# Patient Record
Sex: Male | Born: 1990 | Race: White | Hispanic: No | Marital: Single | Smoking: Never smoker
Health system: Southern US, Community
[De-identification: ages and names within clinical notes are randomized; demographics above are authoritative.]

## PROBLEM LIST (undated history)

## (undated) DIAGNOSIS — K219 Gastro-esophageal reflux disease without esophagitis: Secondary | ICD-10-CM

## (undated) DIAGNOSIS — J302 Other seasonal allergic rhinitis: Secondary | ICD-10-CM

---

## 2012-12-14 ENCOUNTER — Other Ambulatory Visit: Payer: Self-pay | Admitting: Internal Medicine

## 2012-12-14 ENCOUNTER — Ambulatory Visit
Admission: RE | Admit: 2012-12-14 | Discharge: 2012-12-14 | Disposition: A | Discharge status: Home / Self Care | Payer: BC Managed Care – PPO | Source: Ambulatory Visit | Attending: Internal Medicine | Admitting: Internal Medicine

## 2012-12-14 DIAGNOSIS — R042 Hemoptysis: Secondary | ICD-10-CM

## 2013-09-20 ENCOUNTER — Encounter (HOSPITAL_COMMUNITY): Payer: Self-pay | Admitting: Emergency Medicine

## 2013-09-20 ENCOUNTER — Emergency Department (INDEPENDENT_AMBULATORY_CARE_PROVIDER_SITE_OTHER): Payer: BC Managed Care – PPO

## 2013-09-20 ENCOUNTER — Emergency Department (HOSPITAL_COMMUNITY)
Admission: EM | Admit: 2013-09-20 | Discharge: 2013-09-20 | Disposition: A | Discharge status: Home / Self Care | Payer: BC Managed Care – PPO | Source: Home / Self Care | Attending: Family Medicine | Admitting: Family Medicine

## 2013-09-20 DIAGNOSIS — J309 Allergic rhinitis, unspecified: Secondary | ICD-10-CM

## 2013-09-20 DIAGNOSIS — J302 Other seasonal allergic rhinitis: Secondary | ICD-10-CM

## 2013-09-20 LAB — POCT URINALYSIS DIP (DEVICE)
BILIRUBIN URINE: NEGATIVE
Glucose, UA: NEGATIVE mg/dL
Hgb urine dipstick: NEGATIVE
KETONES UR: NEGATIVE mg/dL
LEUKOCYTES UA: NEGATIVE
Nitrite: NEGATIVE
PH: 7 (ref 5.0–8.0)
Protein, ur: NEGATIVE mg/dL
SPECIFIC GRAVITY, URINE: 1.02 (ref 1.005–1.030)
Urobilinogen, UA: 0.2 mg/dL (ref 0.0–1.0)

## 2013-09-20 MED ORDER — FLUTICASONE PROPIONATE 50 MCG/ACT NA SUSP: 1.0000 | Freq: Two times a day (BID) | NASAL | Status: AC

## 2013-09-20 MED ORDER — CETIRIZINE HCL 10 MG PO TABS: 10.0000 mg | ORAL_TABLET | Freq: Every day | ORAL | Status: DC

## 2013-09-20 NOTE — ED Notes (Signed)
Pt       Reports        Symptoms       Of  Cough  Congestion         Low  Back  Pain  As  Well  As  Burning on  urintaion  Pt   Wants  To  Talk  To  Dr    About  Personal issue           Pt   Ambulated  To  Room  With  s  Steady fluid gait

## 2013-09-20 NOTE — ED Provider Notes (Signed)
CSN: [id number]     Arrival date & time 09/20/13  1349 History   First MD Initiated Contact with Patient 09/20/13 1606     Chief Complaint  Patient presents with  . Cough   (Consider location/radiation/quality/duration/timing/severity/associated sxs/prior Treatment) Patient is a 23 y.o. male presenting with cough. The history is provided by the patient.  Cough Cough characteristics:  Non-productive Severity:  Moderate Onset quality:  Gradual Duration:  10 days Chronicity:  New Smoker: no   Context: exposure to allergens, upper respiratory infection and weather changes   Relieved by:  None tried Worsened by:  Nothing tried Ineffective treatments:  None tried Associated symptoms: rhinorrhea   Associated symptoms: no fever, no shortness of breath and no wheezing     History reviewed. No pertinent past medical history. History reviewed. No pertinent past surgical history. History reviewed. No pertinent family history. History  Substance Use Topics  . Smoking status: Never Smoker   . Smokeless tobacco: Not on file  . Alcohol Use: No    Review of Systems  Constitutional: Negative.  Negative for fever.  HENT: Positive for congestion, postnasal drip and rhinorrhea.   Respiratory: Positive for cough. Negative for shortness of breath and wheezing.   Cardiovascular: Negative.     Allergies  Review of patient's allergies indicates no known allergies.  Home Medications   Prior to Admission medications   Medication Sig Start Date End Date Taking? Authorizing Provider  cetirizine (ZYRTEC) 10 MG tablet Take 1 tablet (10 mg total) by mouth daily. One tab daily for allergies 09/20/13   Linna Hoff, MD  fluticasone [facility]) 50 MCG/ACT nasal spray Place 1 spray into both nostrils 2 (two) times daily. 09/20/13   Linna Hoff, MD   BP 130/81  Pulse 68  Temp(Src) 98.4 F (36.9 C) (Oral)  Resp 14  SpO2 100% Physical Exam  Nursing note and vitals reviewed. Constitutional: He is  oriented to person, place, and time. He appears well-developed and well-nourished.  Neck: Normal range of motion. Neck supple.  Cardiovascular: Normal heart sounds.   Pulmonary/Chest: Effort normal and breath sounds normal.  Lymphadenopathy:    He has no cervical adenopathy.  Neurological: He is alert and oriented to person, place, and time.  Skin: Skin is warm and dry.    ED Course  Procedures (including critical care time) Labs Review Labs Reviewed  POCT URINALYSIS DIP (DEVICE)    Imaging Review Dg Chest 2 View  09/20/2013   CLINICAL DATA:  Shortness of breath and chest congestion which began approximately 10 days ago. Cough and chest pain which began approximately 4 days ago.  EXAM: CHEST  2 VIEW  COMPARISON:  CT chest 12/14/2012.  FINDINGS: Cardiomediastinal silhouette unremarkable. Pleuroparenchymal scarring at the left base, unchanged from the prior CT. Lungs otherwise clear. No localized airspace consolidation. No pleural effusions. No pneumothorax. Normal pulmonary vascularity. Visualized bony thorax intact.  IMPRESSION: No acute cardiopulmonary disease. Stable pleuroparenchymal scarring at the left base.   Electronically Signed   By: Hulan Saas M.D.   On: 09/20/2013 16:46     MDM   1. Seasonal allergic rhinitis       Linna Hoff, MD 09/20/13 1655

## 2013-09-25 ENCOUNTER — Encounter: Payer: Self-pay | Admitting: Emergency Medicine

## 2013-09-25 ENCOUNTER — Emergency Department
Admission: EM | Admit: 2013-09-25 | Discharge: 2013-09-25 | Disposition: A | Discharge status: Home / Self Care | Payer: BC Managed Care – PPO | Source: Home / Self Care | Attending: Family Medicine | Admitting: Family Medicine

## 2013-09-25 ENCOUNTER — Telehealth: Payer: Self-pay | Admitting: *Deleted

## 2013-09-25 DIAGNOSIS — Q828 Other specified congenital malformations of skin: Secondary | ICD-10-CM

## 2013-09-25 DIAGNOSIS — L858 Other specified epidermal thickening: Secondary | ICD-10-CM

## 2013-09-25 DIAGNOSIS — J209 Acute bronchitis, unspecified: Secondary | ICD-10-CM

## 2013-09-25 HISTORY — DX: Other seasonal allergic rhinitis: J30.2

## 2013-09-25 HISTORY — DX: Gastro-esophageal reflux disease without esophagitis: K21.9

## 2013-09-25 MED ORDER — DOXYCYCLINE HYCLATE 100 MG PO CAPS: 100.0000 mg | ORAL_CAPSULE | Freq: Two times a day (BID) | ORAL | Status: DC

## 2013-09-25 NOTE — ED Notes (Signed)
Pt c/o nasal congestion, cough, post nasal drip, and sore throat x 3 wks. He reports being seen at [facility] urgent care, had a negative CVR and was given allergy meds which helped some. He also, c/o panic attacks x 5-6 wks. He has an appt with a psychiatrist tomorrow. He also c/o LBP, groin, and testicular pain x 4-5 days after mediation.

## 2013-09-25 NOTE — ED Provider Notes (Signed)
CSN: [id number]     Arrival date & time 09/25/13  1450 History   First MD Initiated Contact with Patient 09/25/13 1535     Chief Complaint  Patient presents with  . Nasal Congestion  . Testicle Pain  . Back Pain  . Anxiety     HPI Comments: Patient presents with a variety of complaints: 1)  His primary complaint is a persistent cough.  He developed a cold-like illness about two weeks ago with sore throat and nasal congestion, but the non-productive cough has persisted.  No fevers, chills, and sweats and he feels well otherwise.  He has a history of pneumonia December 2014.  He visited [facility] urgent care where a chest X-ray was negative and diagnosed with seasonal allergic rhinitis.  He was advised to begin antihistamine product.  He denies pleuritic pain or shortness of breath.  He has a history of GERD but he has not had a cough associated with that. 2)  He complains of one week history of vague lower back pain that occasionally radiates to his left leg, worse when sitting and decreased when standing.  No bowel or bladder dysfunction.  No saddle numbness.  He states that he has resumed working out in a gym over the past several months after having been sedentary in school (just graduated from college).  He also complains of vague bilateral knee pain when exercising and pain in his right foot. 3)  He complains of a lingering rash on his upper arms and upper back, slightly pruritic.  4)   He complains of anxiety and panic attacks for about 6 weeks but admits that he has an appointment with psychiatrist tomorrow.   The history is provided by the patient.    Past Medical History  Diagnosis Date  . Seasonal allergies   . GERD (gastroesophageal reflux disease)    History reviewed. No pertinent past surgical history. Family History  Problem Relation Age of Onset  . Diabetes Father    History  Substance Use Topics  . Smoking status: Never Smoker   . Smokeless tobacco: Not on file  .  Alcohol Use: No    Review of Systems + Anxiety; denies depression No sore throat + cough No pleuritic pain No wheezing + nasal congestion No post-nasal drainage No sinus pain/pressure No itchy/red eyes No earache No hemoptysis No SOB No fever/chills No nausea No vomiting No abdominal pain No diarrhea No urinary symptoms No skin rash No fatigue No myalgias + lower back pain, knee pain, and right foot pain. No headache Used OTC meds without relief  Allergies  Review of patient's allergies indicates no known allergies.  Home Medications   Prior to Admission medications   Medication Sig Start Date End Date Taking? Authorizing Provider  cetirizine (ZYRTEC) 10 MG tablet Take 1 tablet (10 mg total) by mouth daily. One tab daily for allergies 09/20/13   Linna Hoff, MD  doxycycline (VIBRAMYCIN) 100 MG capsule Take 1 capsule (100 mg total) by mouth 2 (two) times daily. 09/25/13   Lattie Haw, MD  fluticasone (FLONASE) 50 MCG/ACT nasal spray Place 1 spray into both nostrils 2 (two) times daily. 09/20/13   Linna Hoff, MD   BP 121/76  Pulse 94  Temp(Src) 98.4 F (36.9 C) (Oral)  Resp 16  Ht 6' (1.829 m)  Wt 227 lb (102.967 kg)  BMI 30.78 kg/m2  SpO2 98% Physical Exam  Nursing note and vitals reviewed. Constitutional: He is oriented to person, place,  and time. He appears well-developed and well-nourished. No distress.  Patient is noted to ambulate, and climb onto exam table comfortably and without pain.  HENT:  Head: Normocephalic and atraumatic.  Nose: Nose normal.  Mouth/Throat: Oropharynx is clear and moist.  Eyes: Conjunctivae are normal. Pupils are equal, round, and reactive to light.  Neck: Neck supple.  Cardiovascular: Normal heart sounds.   Pulmonary/Chest: Breath sounds normal.  Abdominal: Bowel sounds are normal. There is no tenderness.  Musculoskeletal: He exhibits no edema.  Lower back has minimal tenderness over SI joints.  Straight leg raising test  is negative.  Sitting knee extension test is negative.  Strength and sensation in the lower extremities is normal.  Patellar and achilles reflexes are normal.  Both knees have full range of motion and are not tender to palpation.  Lymphadenopathy:    He has no cervical adenopathy.  Neurological: He is alert and oriented to person, place, and time.  Skin: Skin is warm and dry.     Patient's upper arms and upper back have follicular plugging and a "sandpaper" texture.  No erythema.  Psychiatric: He has a normal mood and affect. His behavior is normal. Judgment and thought content normal.    ED Course  Procedures  none         MDM   1. Acute bronchitis   2. Keratosis pilaris    Empirically begin doxycycline to cover atypicals. Take plain Mucinex (1200 mg guaifenesin) twice daily for cough and congestion.  May add Sudafed for sinus congestion.   Increase fluid intake, rest. May use Afrin nasal spray (or generic oxymetazoline) twice daily for about 5 days.  Also recommend using saline nasal spray several times daily and saline nasal irrigation (AYR is a common brand) Stop all antihistamines for now, and other non-prescription cough/cold preparations. May take Delsym Cough Suppressant at bedtime for nighttime cough.   For skin condition, use medicated creams containing alpha-hydroxy acid, lactic acid, salicylic acid or urea.  For his musculoskeletal complaints (probably resulting from recent resumption of athletic activity), recommend that he follow-up with Dr. Tyson Babinskihomas Thekkekandam     Ebba Goll A Dailyn Reith, MD 09/28/13 [phone]

## 2013-09-25 NOTE — Discharge Instructions (Signed)
Take plain Mucinex (1200 mg guaifenesin) twice daily for cough and congestion.  May add Sudafed for sinus congestion.   Increase fluid intake, rest. May use Afrin nasal spray (or generic oxymetazoline) twice daily for about 5 days.  Also recommend using saline nasal spray several times daily and saline nasal irrigation (AYR is a common brand) Stop all antihistamines for now, and other non-prescription cough/cold preparations. May take Delsym Cough Suppressant at bedtime for nighttime cough.   For skin condition, use medicated creams containing alpha-hydroxy acid, lactic acid, salicylic acid or urea.

## 2013-09-30 ENCOUNTER — Ambulatory Visit (INDEPENDENT_AMBULATORY_CARE_PROVIDER_SITE_OTHER): Payer: BC Managed Care – PPO

## 2013-09-30 ENCOUNTER — Encounter: Payer: Self-pay | Admitting: Sports Medicine

## 2013-09-30 ENCOUNTER — Ambulatory Visit (INDEPENDENT_AMBULATORY_CARE_PROVIDER_SITE_OTHER): Payer: BC Managed Care – PPO | Admitting: Sports Medicine

## 2013-09-30 VITALS — BP 108/69 | HR 69 | Ht 74.0 in | Wt 224.0 lb

## 2013-09-30 DIAGNOSIS — M25469 Effusion, unspecified knee: Secondary | ICD-10-CM

## 2013-09-30 DIAGNOSIS — M2550 Pain in unspecified joint: Secondary | ICD-10-CM

## 2013-09-30 DIAGNOSIS — M25579 Pain in unspecified ankle and joints of unspecified foot: Secondary | ICD-10-CM

## 2013-09-30 MED ORDER — PREDNISONE (PAK) 10 MG PO TABS: ORAL_TABLET | ORAL | Status: DC

## 2013-09-30 NOTE — Progress Notes (Signed)
   Subjective:    I'm seeing this patient as a consultation for:  Dr. Cathren Harsh  CC: Multiple joint pains  HPI: This is a pleasant 23 year old male, he has just finished college, he studied [place] languages, and lived in [place] for sometime. He has not been out of the country for years. For the past several weeks he has noted increasing popping, and tightness and soreness in both ankles, both knees, and his back. Pain is moderate, persistent, he denies any trauma, no constitutional symptoms. Symptoms are moderate, persistent.  Past medical history, Surgical history, Family history not pertinant except as noted below, Social history, Allergies, and medications have been entered into the medical record, reviewed, and no changes needed.   Review of Systems: No headache, visual changes, nausea, vomiting, diarrhea, constipation, dizziness, abdominal pain, skin rash, fevers, chills, night sweats, weight loss, swollen lymph nodes, body aches, joint swelling, muscle aches, chest pain, shortness of breath, mood changes, visual or auditory hallucinations.   Objective:   General: Well Developed, well nourished, and in no acute distress.  Neuro/Psych: Alert and oriented x3, extra-ocular muscles intact, able to move all 4 extremities, sensation grossly intact. Skin: Warm and dry, no rashes noted.  Respiratory: Not using accessory muscles, speaking in full sentences, trachea midline.  Cardiovascular: Pulses palpable, no extremity edema. Abdomen: Does not appear distended. Back Exam:  Inspection: Unremarkable  Motion: Flexion 45 deg, Extension 45 deg, Side Bending to 45 deg bilaterally,  Rotation to 45 deg bilaterally  SLR laying: Negative  XSLR laying: Negative  Palpable tenderness: None. FABER: negative. Sensory change: Gross sensation intact to all lumbar and sacral dermatomes.  Reflexes: 2+ at both patellar tendons, 2+ at achilles tendons, Babinski's downgoing.  Strength at foot  Plantar-flexion: 5/5  Dorsi-flexion: 5/5 Eversion: 5/5 Inversion: 5/5  Leg strength  Quad: 5/5 Hamstring: 5/5 Hip flexor: 5/5 Hip abductors: 5/5  Gait unremarkable. Bilateral Knee: Normal to inspection with no erythema or effusion or obvious bony abnormalities. Palpation normal with no warmth, joint line tenderness, patellar tenderness, or condyle tenderness. ROM full in flexion and extension and lower leg rotation. Ligaments with solid consistent endpoints including ACL, PCL, LCL, MCL. Negative Mcmurray's, Apley's, and Thessalonian tests. Non painful patellar compression. Patellar glide without crepitus. Patellar and quadriceps tendons unremarkable. Hamstring and quadriceps strength is normal.  Bilateral Ankle: No visible erythema or swelling. Range of motion is full in all directions. Strength is 5/5 in all directions. Stable lateral and medial ligaments; squeeze test and kleiger test unremarkable; Talar dome nontender; No pain at base of 5th MT; No tenderness over cuboid; No tenderness over N spot or navicular prominence No tenderness on posterior aspects of lateral and medial malleolus No sign of peroneal tendon subluxations or tenderness to palpation Negative tarsal tunnel tinel's Able to walk 4 steps.  Ankle, spine x-rays are negative, knee x-rays do show a mild effusion.  Impression and Recommendations:   This case required medical decision making of moderate complexity.

## 2013-09-30 NOTE — Assessment & Plan Note (Addendum)
I think that Andres Murphy has the majority of benign findings. Considering his polyarthralgias we are going to check a rheumatoid and arthritis panel, also like to check an HLA-B27 and x-ray his ankles, knees, and lumbar spine.  We are going to do a 12 day taper of prednisone and like to see him back. He did mention some slight numbness in his groin, and pain radiating from his back down his legs so we will probably get an early MRI of the two-week point if he's not better.  Considering his travels to [place] we did need to consider viral etiologies as well if the initial workup is negative.

## 2013-10-01 LAB — COMPREHENSIVE METABOLIC PANEL
Alkaline Phosphatase: 54 U/L (ref 39–117)
Chloride: 103 mEq/L (ref 96–112)
Glucose, Bld: 86 mg/dL (ref 70–99)
Sodium: 140 mEq/L (ref 135–145)
Total Bilirubin: 1.1 mg/dL (ref 0.2–1.2)
Total Protein: 7.3 g/dL (ref 6.0–8.3)

## 2013-10-01 LAB — SEDIMENTATION RATE: Sed Rate: 1 mm/h (ref 0–16)

## 2013-10-01 LAB — HLA-B27 ANTIGEN: DNA Result:: NOT DETECTED

## 2013-10-01 LAB — CYCLIC CITRUL PEPTIDE ANTIBODY, IGG: Cyclic Citrullin Peptide Ab: <2 U/mL (ref 0.0–5.0)

## 2013-10-01 LAB — CBC WITH DIFFERENTIAL/PLATELET
Basophils Absolute: 0.1 K/uL (ref 0.0–0.1)
Basophils Relative: 1 % (ref 0–1)
Eosinophils Absolute: 0.5 10*3/uL (ref 0.0–0.7)
Eosinophils Relative: 8 % — ABNORMAL HIGH (ref 0–5)
HCT: 47.9 % (ref 39.0–52.0)
Hemoglobin: 17.1 g/dL — ABNORMAL HIGH (ref 13.0–17.0)
Lymphocytes Relative: 33 % (ref 12–46)
Lymphs Abs: 2 10*3/uL (ref 0.7–4.0)
MCH: 29.1 pg (ref 26.0–34.0)
MCHC: 35.7 g/dL (ref 30.0–36.0)
MCV: 81.6 fL (ref 78.0–100.0)
Monocytes Absolute: 0.4 K/uL (ref 0.1–1.0)
Monocytes Relative: 7 % (ref 3–12)
Neutro Abs: 3.2 K/uL (ref 1.7–7.7)
Neutrophils Relative %: 51 % (ref 43–77)
Platelets: 133 10*3/uL — ABNORMAL LOW (ref 150–400)
RBC: 5.87 MIL/uL — ABNORMAL HIGH (ref 4.22–5.81)
RDW: 13.6 % (ref 11.5–15.5)
WBC: 6.2 K/uL (ref 4.0–10.5)

## 2013-10-01 LAB — CK: Total CK: 241 U/L — ABNORMAL HIGH (ref 7–232)

## 2013-10-01 LAB — COMPREHENSIVE METABOLIC PANEL WITH GFR
ALT: 18 U/L (ref 0–53)
AST: 19 U/L (ref 0–37)
Albumin: 4.9 g/dL (ref 3.5–5.2)
BUN: 9 mg/dL (ref 6–23)
CO2: 26 meq/L (ref 19–32)
Calcium: 9.6 mg/dL (ref 8.4–10.5)
Creat: 0.75 mg/dL (ref 0.50–1.35)
Potassium: 3.9 meq/L (ref 3.5–5.3)

## 2013-10-01 LAB — RHEUMATOID FACTOR: Rheumatoid fact SerPl-aCnc: <10 [IU]/mL (ref <=14)

## 2013-10-01 LAB — ROCKY MTN SPOTTED FVR ABS PNL(IGG+IGM)
RMSF IgG: 0.1 IV
RMSF IgM: 0.27 IV

## 2013-10-01 LAB — ANA: Anti Nuclear Antibody(ANA): NEGATIVE

## 2013-10-01 LAB — URIC ACID: Uric Acid, Serum: 6.8 mg/dL (ref 4.0–7.8)

## 2013-10-01 LAB — B. BURGDORFI ANTIBODIES: B burgdorferi Ab IgG+IgM: 0.28 {ISR}

## 2013-10-03 LAB — EHRLICHIA ANTIBODY PANEL
E chaffeensis (HGE) Ab, IgG: <1:64 {titer}
E chaffeensis (HGE) Ab, IgM: <1:20 {titer}

## 2013-10-04 ENCOUNTER — Encounter: Payer: Self-pay | Admitting: Sports Medicine

## 2013-10-04 ENCOUNTER — Ambulatory Visit (INDEPENDENT_AMBULATORY_CARE_PROVIDER_SITE_OTHER): Payer: BC Managed Care – PPO | Admitting: Sports Medicine

## 2013-10-04 VITALS — BP 142/66 | HR 82 | Ht 74.0 in | Wt 225.0 lb

## 2013-10-04 DIAGNOSIS — M2550 Pain in unspecified joint: Secondary | ICD-10-CM

## 2013-10-04 DIAGNOSIS — D696 Thrombocytopenia, unspecified: Secondary | ICD-10-CM

## 2013-10-04 NOTE — Assessment & Plan Note (Signed)
50% improvement on prednisone. There were some abnormalities in his lab work, thrombocytopenia, mildly elevated CK, and elevated eosinophil count. He did have some of these symptoms while living in [place], we are going to check for Chikungunya and Dengue viral serologies, as well as vasculitis (he did tell me he had some abnormal splotches on his skin after working out). Certainly if none of these come back positive we can consider muscle biopsy. Considering elevated eosinophils and diarrhea we are going to check stool studies, and serologies for parasitic infections.

## 2013-10-04 NOTE — Progress Notes (Signed)
    Subjective:    CC: Followup  HPI: Andres Murphy returns, he had vague myalgias, joint aches, fatigue, after returning from [place]. He does have occasional diarrhea. We got some blood work, he was mildly thrombocytopenic which he tells us has been present before, he had a mildly elevated CK, and elevated eosinophils. All of the other rheumatoid and tickborne serologies were negative. We placed him on a prednisone taper he tells me he is approximately 50% better.  Past medical history, Surgical history, Family history not pertinant except as noted below, Social history, Allergies, and medications have been entered into the medical record, reviewed, and no changes needed.   Review of Systems: No fevers, chills, night sweats, weight loss, chest pain, or shortness of breath.   Objective:    General: Well Developed, well nourished, and in no acute distress.  Neuro: Alert and oriented x3, extra-ocular muscles intact, sensation grossly intact.  HEENT: Normocephalic, atraumatic, pupils equal round reactive to light, neck supple, no masses, no lymphadenopathy, thyroid nonpalpable.  Skin: Warm and dry, no rashes. Cardiac: Regular rate and rhythm, no murmurs rubs or gallops, no lower extremity edema. Respiratory: Clear to auscultation bilaterally. Not using accessory muscles, speaking in full sentences.  Impression and Recommendations:

## 2013-10-04 NOTE — Assessment & Plan Note (Signed)
As though this is chronic, he tells me he's had low platelets on a prior blood test. We will keep an eye on this.

## 2013-10-11 ENCOUNTER — Other Ambulatory Visit: Payer: Self-pay | Admitting: Sports Medicine

## 2013-10-12 LAB — C. DIFFICILE GDH AND TOXIN A/B
C. difficile GDH: NOT DETECTED
C. difficile Toxin A/B: NOT DETECTED

## 2013-10-14 LAB — OVA AND PARASITE EXAMINATION: OP: NONE SEEN

## 2013-10-15 LAB — GIARDIA ANTIGEN: Giardia Screen (EIA): NEGATIVE

## 2013-10-15 LAB — STOOL CULTURE

## 2013-10-18 ENCOUNTER — Ambulatory Visit (INDEPENDENT_AMBULATORY_CARE_PROVIDER_SITE_OTHER): Payer: BC Managed Care – PPO | Admitting: Sports Medicine

## 2013-10-18 ENCOUNTER — Encounter: Payer: Self-pay | Admitting: Sports Medicine

## 2013-10-18 VITALS — BP 126/74 | HR 85 | Ht 74.0 in | Wt 227.0 lb

## 2013-10-18 DIAGNOSIS — R7989 Other specified abnormal findings of blood chemistry: Secondary | ICD-10-CM

## 2013-10-18 DIAGNOSIS — L503 Dermatographic urticaria: Secondary | ICD-10-CM

## 2013-10-18 DIAGNOSIS — R799 Abnormal finding of blood chemistry, unspecified: Secondary | ICD-10-CM | POA: Insufficient documentation

## 2013-10-18 DIAGNOSIS — D696 Thrombocytopenia, unspecified: Secondary | ICD-10-CM

## 2013-10-18 MED ORDER — LORATADINE 10 MG PO TABS: 10.0000 mg | ORAL_TABLET | Freq: Every day | ORAL | Status: AC

## 2013-10-18 NOTE — Progress Notes (Signed)
  Subjective:    CC: Followup  HPI: Andres Murphy returns with various vague symptoms, muscle aches, bodyaches, occasional fatigue, back pain, occasional loose stools, vague and recurrent rashes, he did recently return from [place], he has been tested for Chikungunya and Dengue viral serologies and all were negative. He also has thrombocytopenia, mild polycythemia, he had an elevated CK level.  Past medical history, Surgical history, Family history not pertinant except as noted below, Social history, Allergies, and medications have been entered into the medical record, reviewed, and no changes needed.   Review of Systems: No fevers, chills, night sweats, weight loss, chest pain, or shortness of breath.   Objective:    General: Well Developed, well nourished, and in no acute distress.  Neuro: Alert and oriented x3, extra-ocular muscles intact, sensation grossly intact.  HEENT: Normocephalic, atraumatic, pupils equal round reactive to light, neck supple, no masses, no lymphadenopathy, thyroid nonpalpable.  Skin: Warm and dry, no rashes. He does have some dermatographia to very light touch. Cardiac: Regular rate and rhythm, no murmurs rubs or gallops, no lower extremity edema.  Respiratory: Clear to auscultation bilaterally. Not using accessory muscles, speaking in full sentences.  Impression and Recommendations:    I spent 40 minutes with this patient, greater than 50% was face-to-face time counseling regarding the below diagnoses.

## 2013-10-18 NOTE — Assessment & Plan Note (Signed)
With arthralgias, thrombocytopenia, and eosinophilia. At this point we are going to send him to immunology. I'm also going to start daily loratadine.

## 2013-10-18 NOTE — Assessment & Plan Note (Signed)
This is mild, and has been present before. We will keep an eye on this.

## 2013-10-18 NOTE — Assessment & Plan Note (Signed)
I am at a bit of a loss with this case. 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 We are going to refer to rheumatology.

## 2013-11-01 ENCOUNTER — Other Ambulatory Visit: Payer: Self-pay | Admitting: Rheumatology

## 2013-11-01 ENCOUNTER — Ambulatory Visit
Admission: RE | Admit: 2013-11-01 | Discharge: 2013-11-01 | Disposition: A | Discharge status: Home / Self Care | Payer: BC Managed Care – PPO | Source: Ambulatory Visit | Attending: Rheumatology | Admitting: Rheumatology

## 2013-11-01 DIAGNOSIS — R768 Other specified abnormal immunological findings in serum: Secondary | ICD-10-CM

## 2015-10-16 IMAGING — CR DG CHEST 2V
3 series · 3 of 3 positions shown · non-contrast
Comparison: CT chest 12/14/2012.

CLINICAL DATA: Shortness of breath and chest congestion which began
approximately 10 days ago. Cough and chest pain which began
approximately 4 days ago.

EXAM:
CHEST  2 VIEW

[view not recorded (1 of 3)]
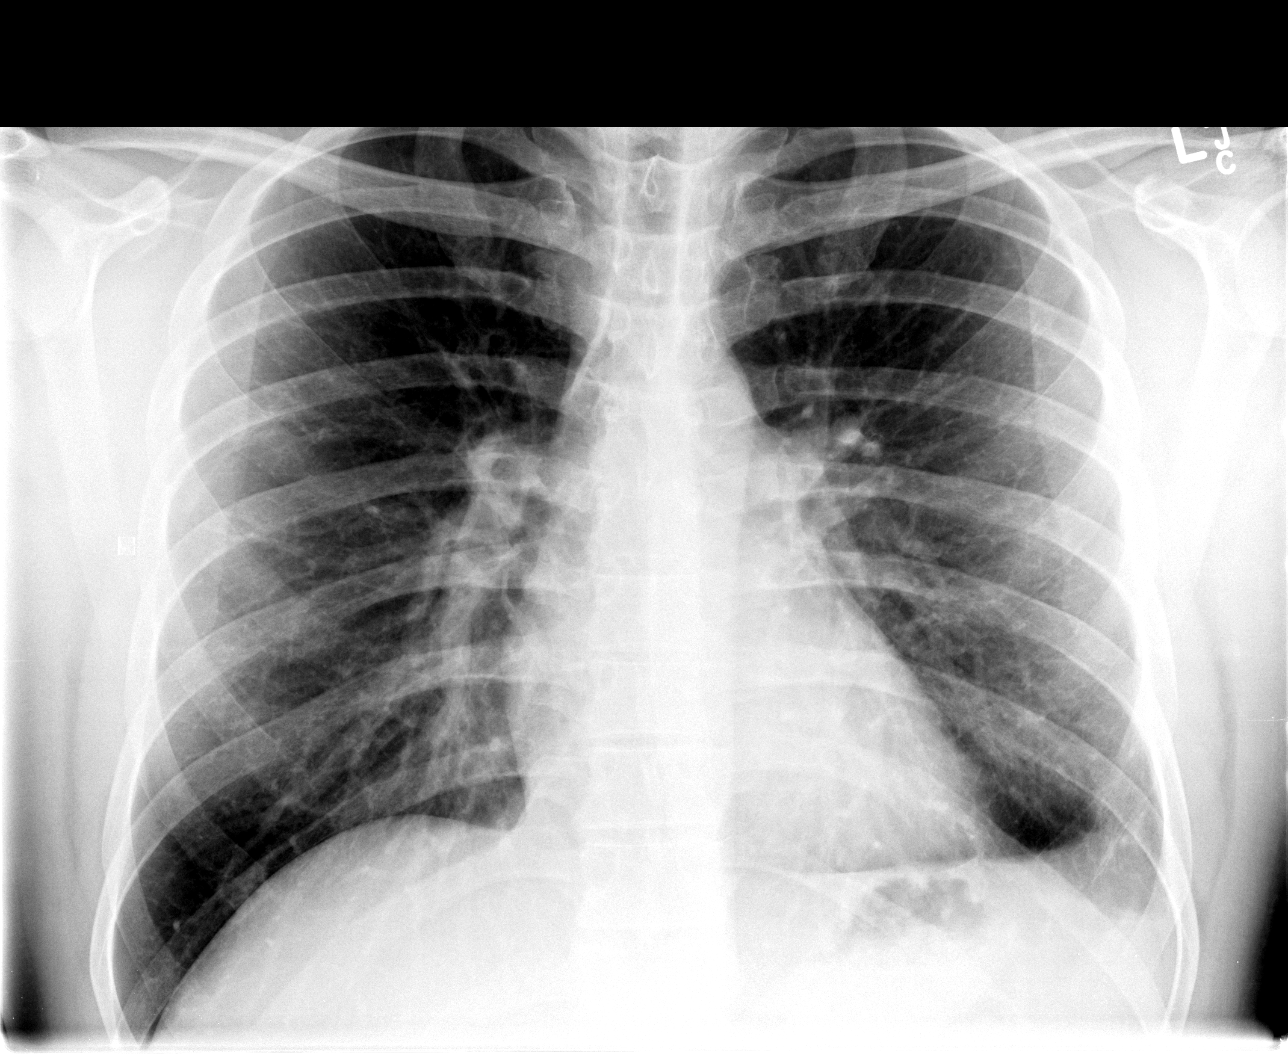

[view not recorded (2 of 3)]
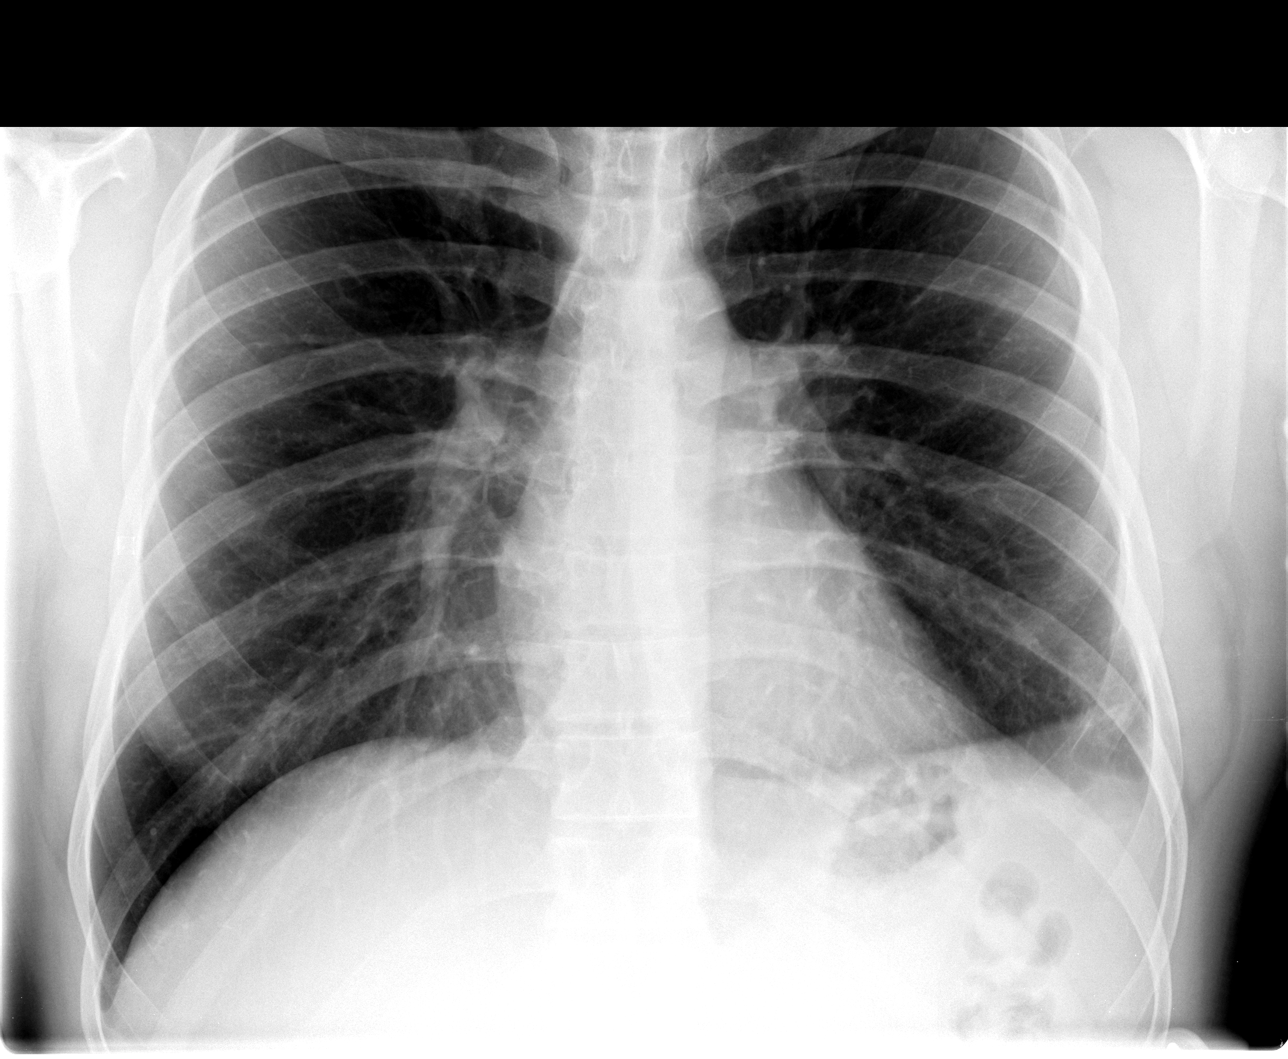

[view not recorded (3 of 3)]
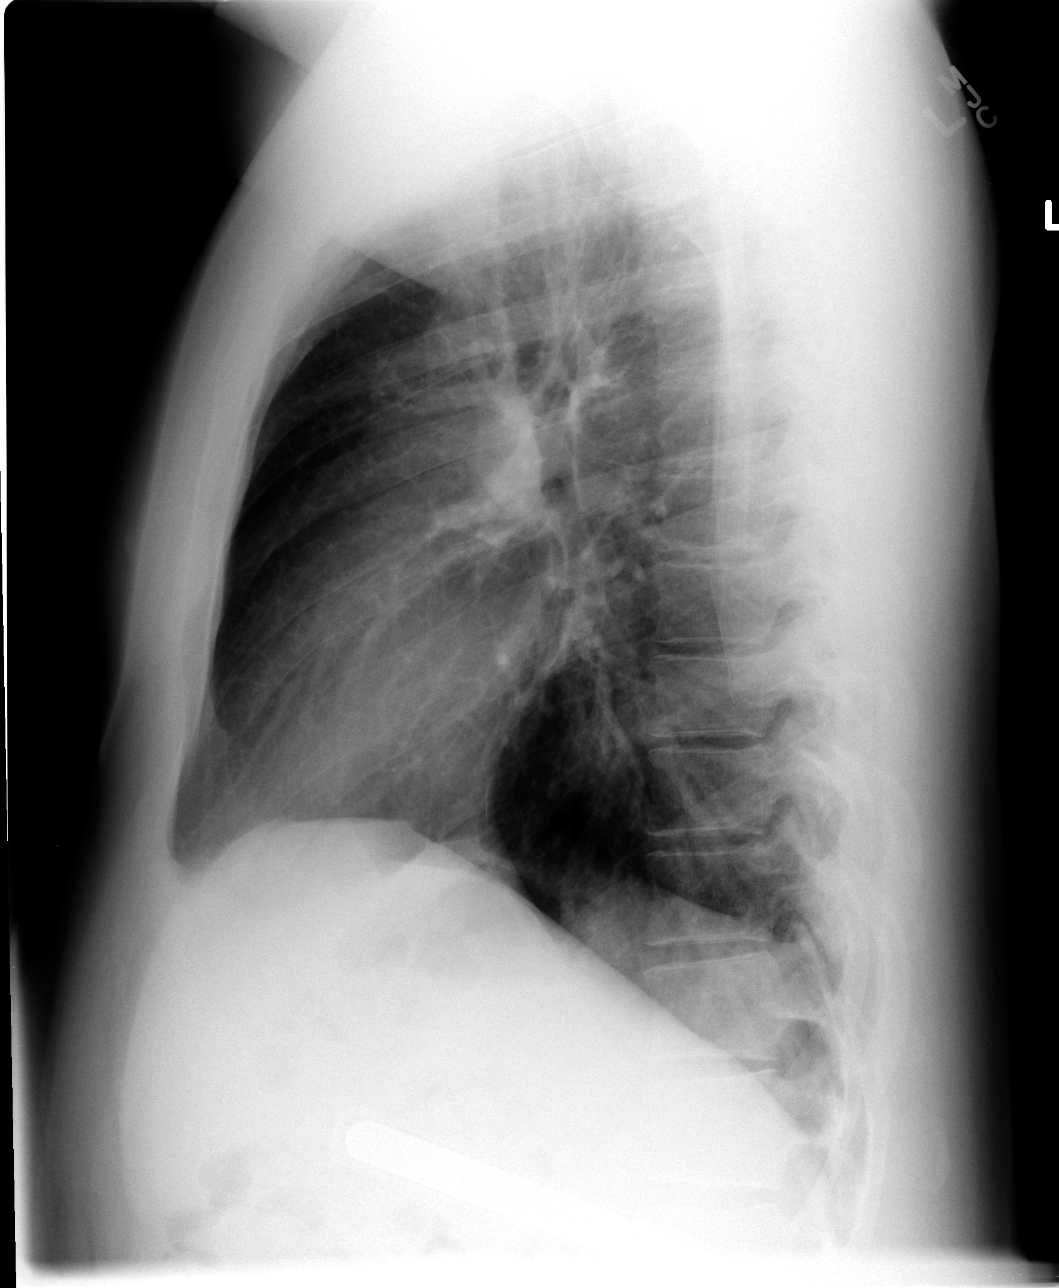

[3 of 3 positions shown; findings below may reference images not displayed]

FINDINGS: Cardiomediastinal silhouette unremarkable. Pleuroparenchymal
scarring at the left base, unchanged from the prior CT. Lungs
otherwise clear. No localized airspace consolidation. No pleural
effusions. No pneumothorax. Normal pulmonary vascularity. Visualized
bony thorax intact.
IMPRESSION: No acute cardiopulmonary disease. Stable pleuroparenchymal scarring
at the left base.
